# Patient Record
Sex: Male | Born: 1985 | Race: White | Hispanic: No | Marital: Single | Smoking: Current every day smoker
Health system: Southern US, Community
[De-identification: ages and names within clinical notes are randomized; demographics above are authoritative.]

## PROBLEM LIST (undated history)

## (undated) DIAGNOSIS — Z789 Other specified health status: Secondary | ICD-10-CM

## (undated) HISTORY — PX: FACIAL FRACTURE SURGERY: SHX1570

---

## 2006-05-29 ENCOUNTER — Emergency Department (HOSPITAL_COMMUNITY): Admission: EM | Admit: 2006-05-29 | Discharge: 2006-05-29 | Payer: Self-pay | Admitting: Emergency Medicine

## 2007-12-22 ENCOUNTER — Emergency Department (HOSPITAL_COMMUNITY): Admission: EM | Admit: 2007-12-22 | Discharge: 2007-12-22 | Payer: Self-pay | Admitting: Emergency Medicine

## 2008-09-17 IMAGING — CR DG HAND COMPLETE 3+V*L*
3 series · 3 of 3 positions shown · non-contrast
Comparison: None.

CLINICAL DATA: Abrasions.
 LEFT HAND ? 3 VIEW:

[view not recorded (1 of 3)]
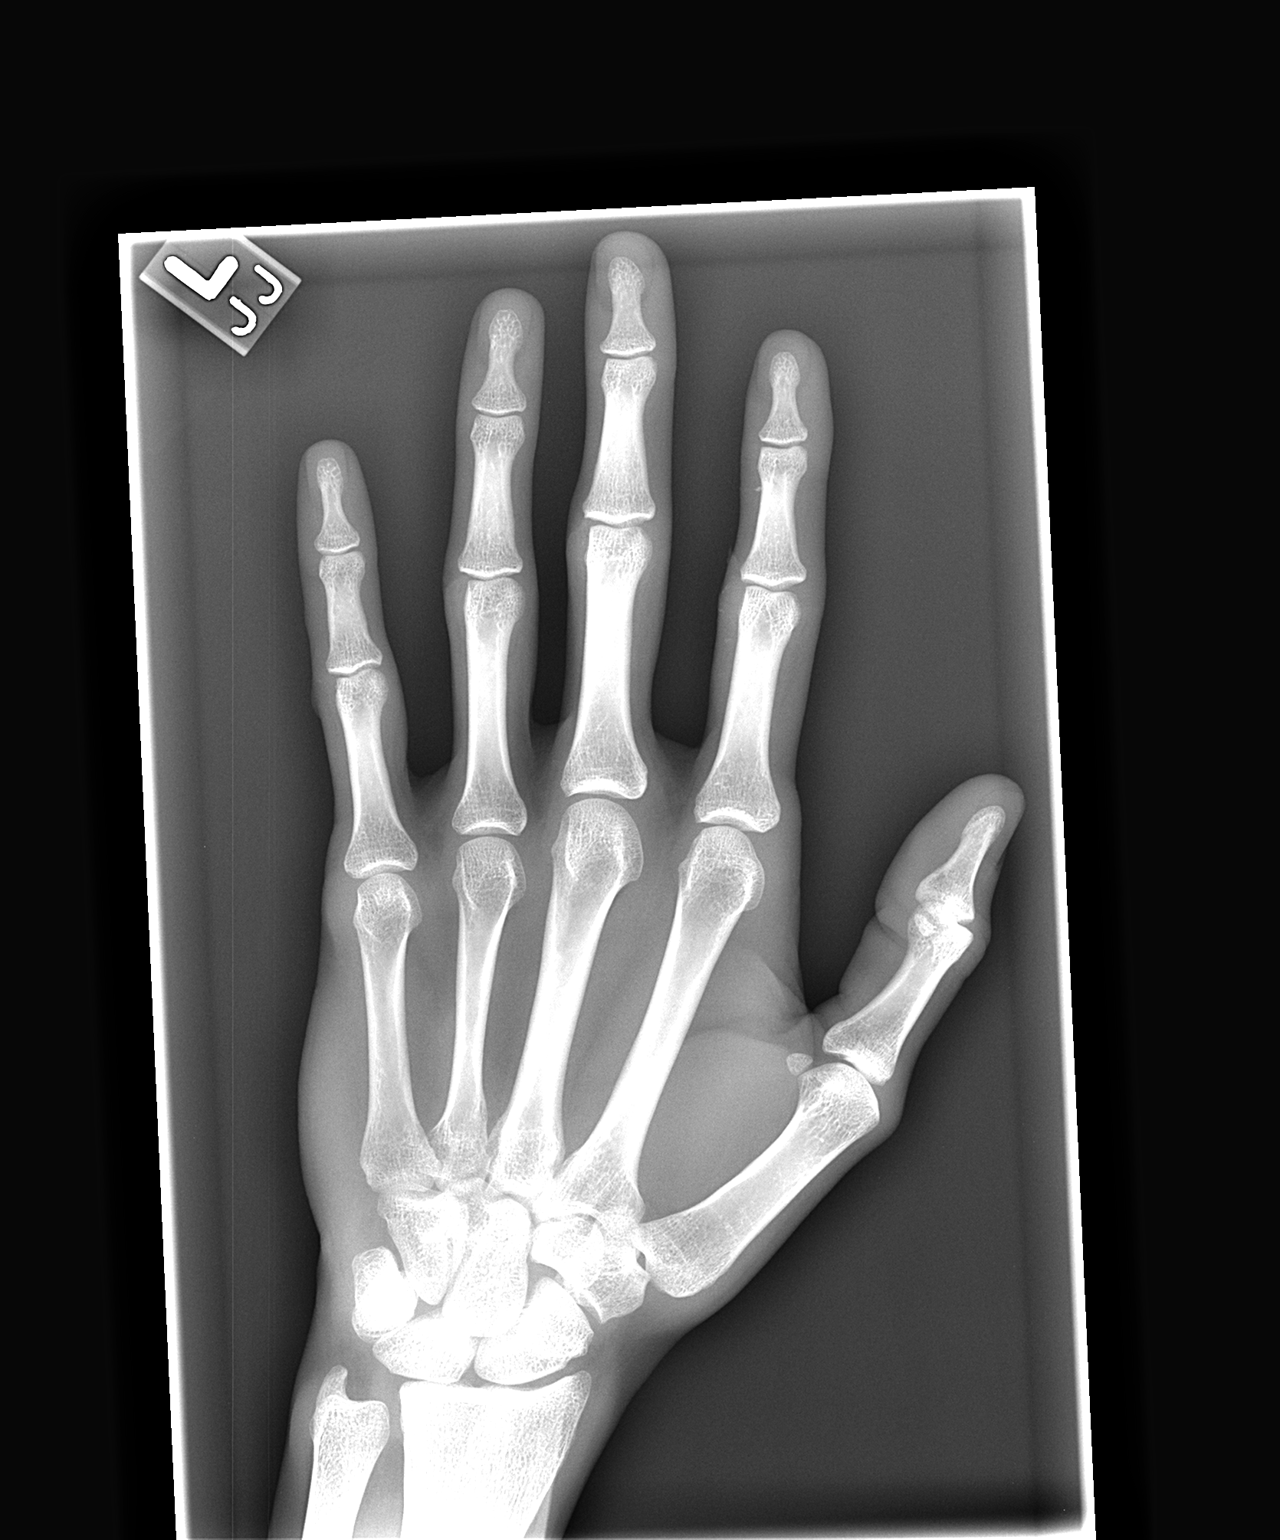

[view not recorded (2 of 3)]
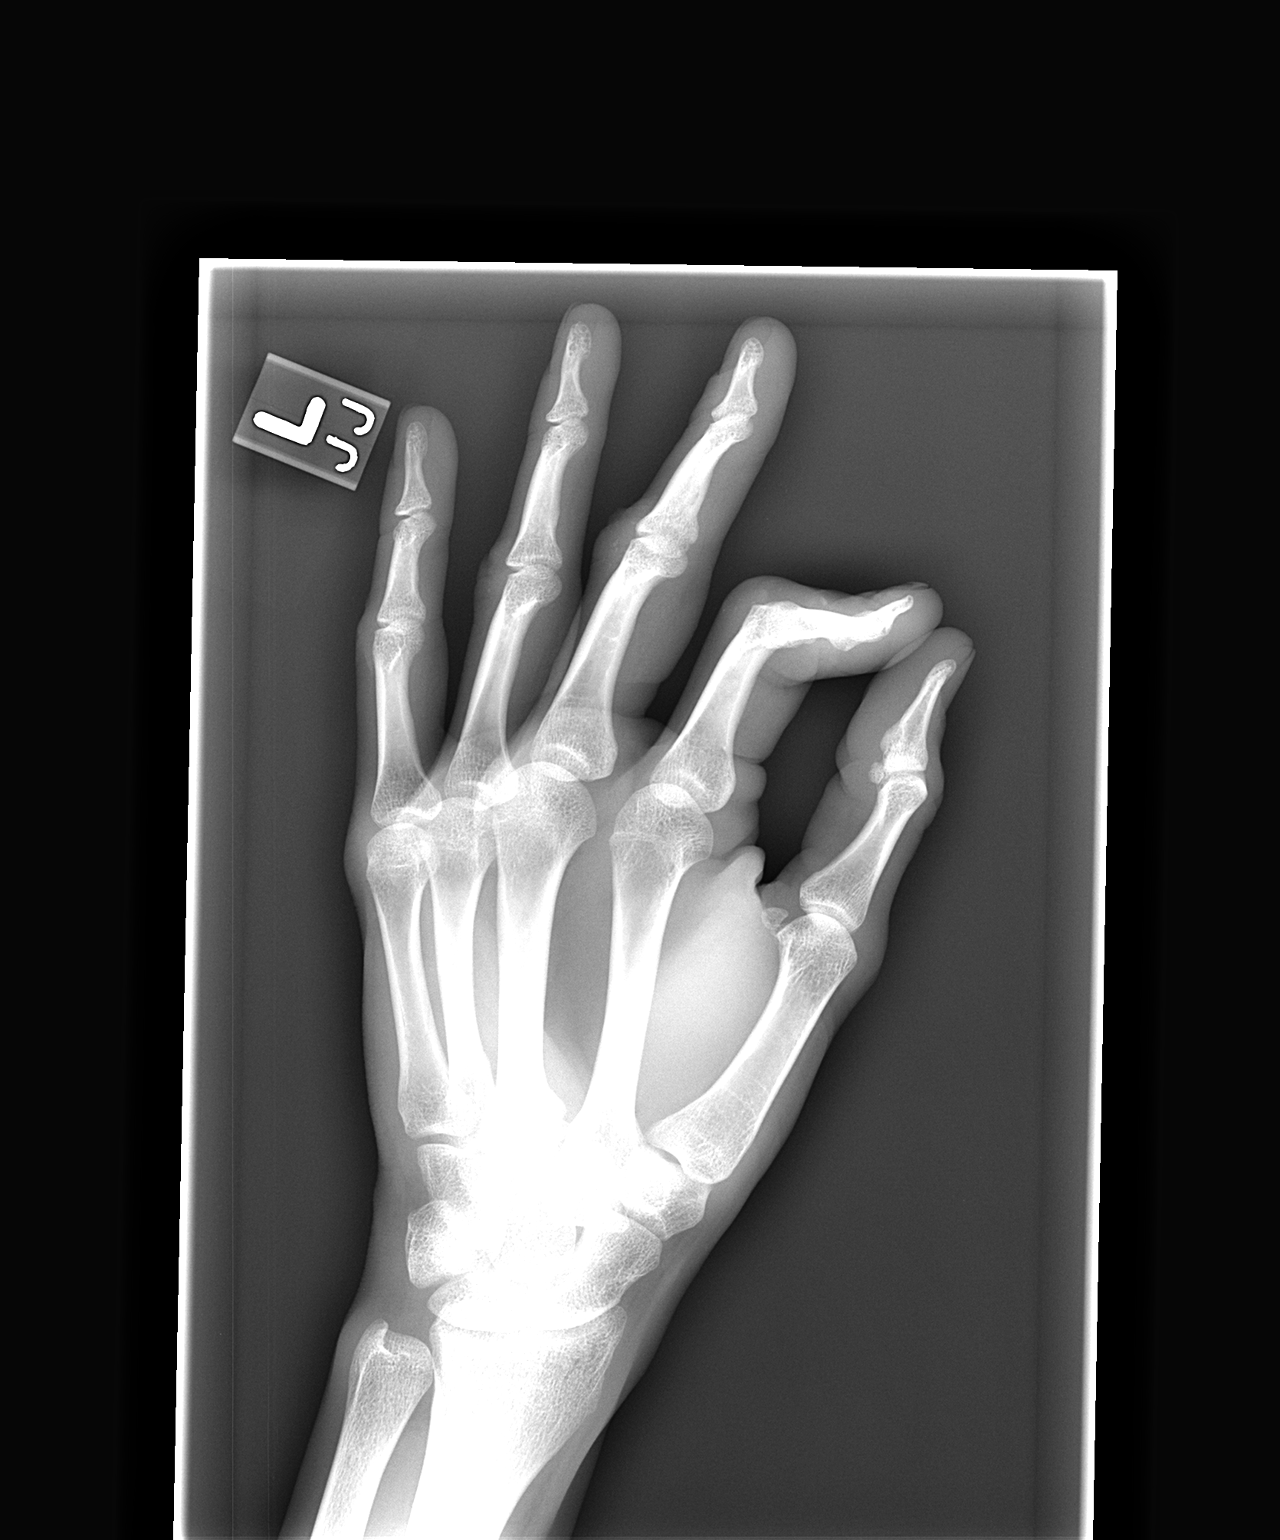

[view not recorded (3 of 3)]
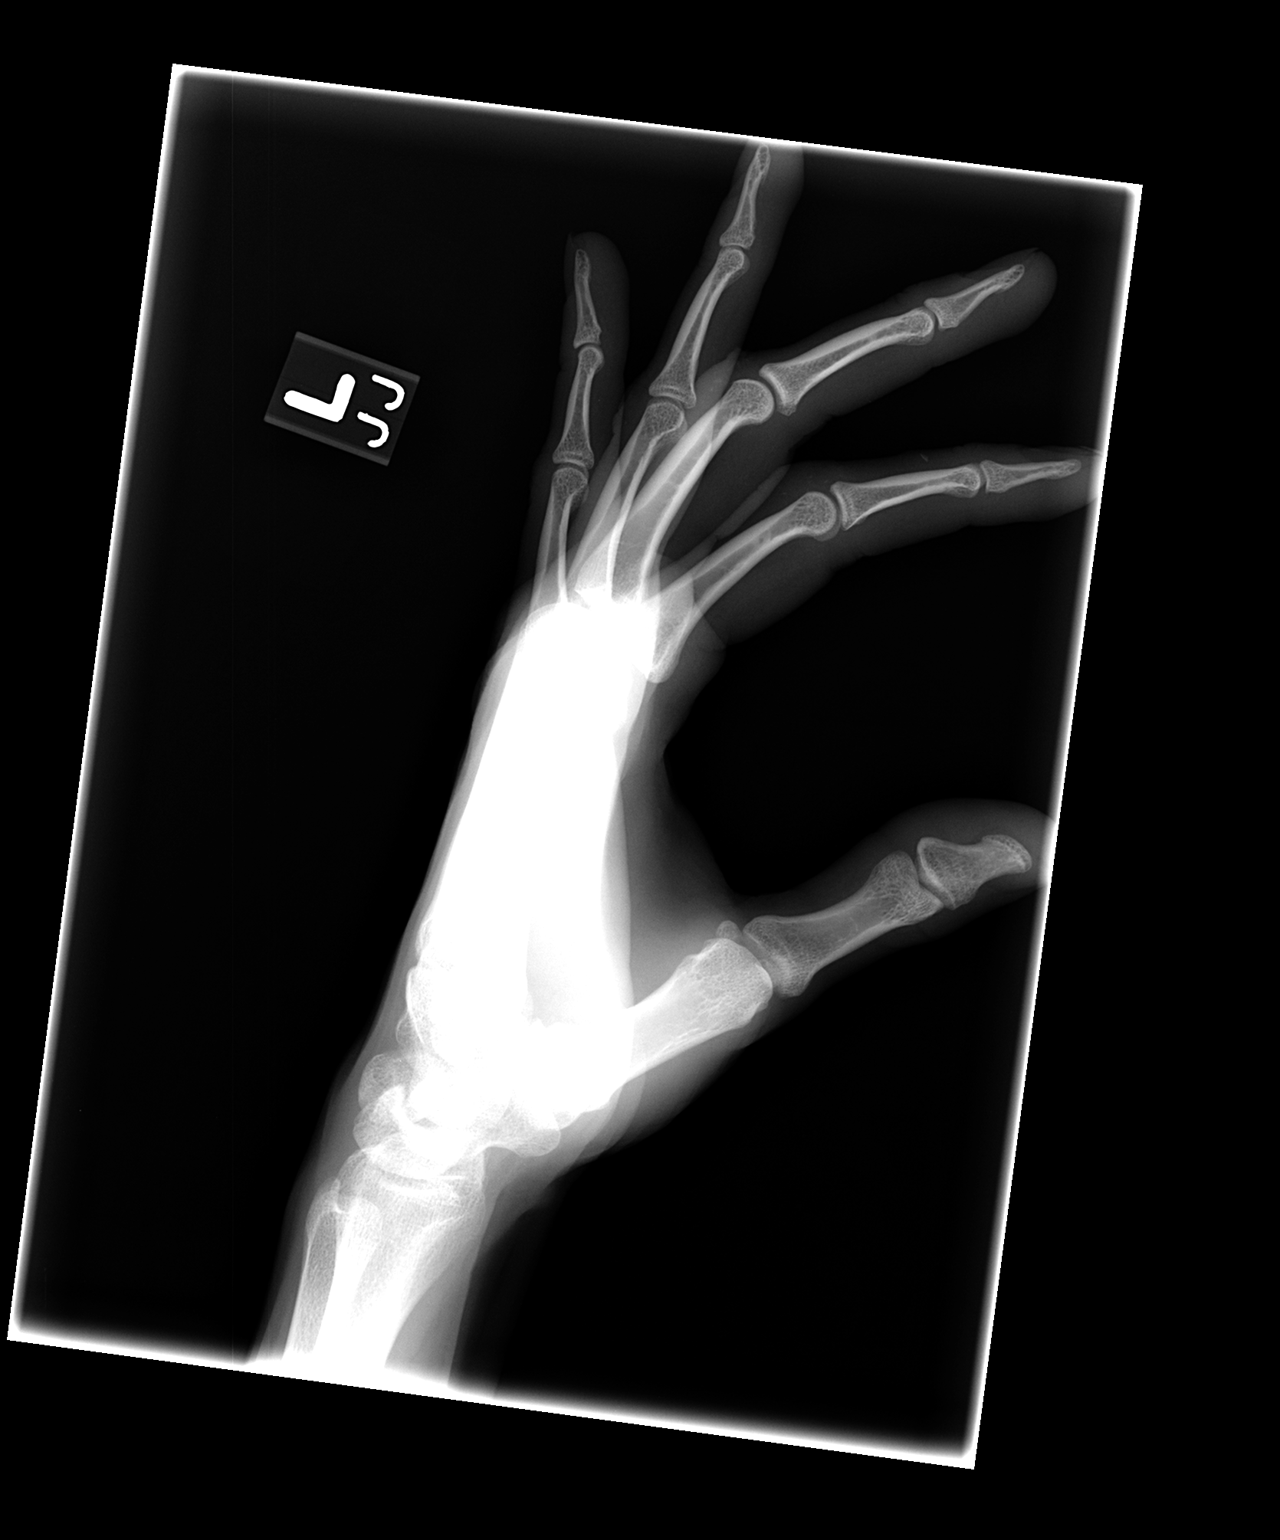

[3 of 3 positions shown; findings below may reference images not displayed]

FINDINGS: There is no fracture or dislocation.  Some radiopaque debris is identified along the PIP joint of the index finger and middle phalanx of the index finger. There also appears to be some debris at the PIP joint of the lung finger and possibly the ring finger.
IMPRESSION: Negative for fracture or dislocation with radiopaque debris most notable in the index finger

## 2015-02-12 ENCOUNTER — Other Ambulatory Visit (HOSPITAL_COMMUNITY): Payer: Self-pay | Admitting: Orthopaedic Surgery

## 2015-02-12 ENCOUNTER — Encounter (HOSPITAL_COMMUNITY): Payer: Self-pay | Admitting: *Deleted

## 2015-02-13 ENCOUNTER — Encounter (HOSPITAL_COMMUNITY): Payer: Self-pay | Admitting: *Deleted

## 2015-02-13 ENCOUNTER — Other Ambulatory Visit (HOSPITAL_COMMUNITY): Payer: Self-pay | Admitting: Orthopaedic Surgery

## 2015-02-13 NOTE — Progress Notes (Signed)
Denies cardiac history. Healthy 29 year old.

## 2015-02-14 ENCOUNTER — Ambulatory Visit (HOSPITAL_COMMUNITY): Payer: Medicaid Other

## 2015-02-14 ENCOUNTER — Observation Stay (HOSPITAL_COMMUNITY)
Admission: RE | Admit: 2015-02-14 | Discharge: 2015-02-15 | Disposition: A | Discharge status: Home / Self Care | Payer: Medicaid Other | Source: Ambulatory Visit | Attending: Orthopaedic Surgery | Admitting: Orthopaedic Surgery

## 2015-02-14 ENCOUNTER — Ambulatory Visit (HOSPITAL_COMMUNITY): Payer: Medicaid Other | Admitting: Certified Registered Nurse Anesthetist

## 2015-02-14 ENCOUNTER — Encounter (HOSPITAL_COMMUNITY)
Admission: RE | Disposition: A | Discharge status: Home / Self Care | Payer: Self-pay | Source: Ambulatory Visit | Attending: Orthopaedic Surgery

## 2015-02-14 ENCOUNTER — Encounter (HOSPITAL_COMMUNITY): Payer: Self-pay | Admitting: General Practice

## 2015-02-14 DIAGNOSIS — F17210 Nicotine dependence, cigarettes, uncomplicated: Secondary | ICD-10-CM | POA: Insufficient documentation

## 2015-02-14 DIAGNOSIS — S42009A Fracture of unspecified part of unspecified clavicle, initial encounter for closed fracture: Secondary | ICD-10-CM | POA: Diagnosis present

## 2015-02-14 DIAGNOSIS — Z791 Long term (current) use of non-steroidal anti-inflammatories (NSAID): Secondary | ICD-10-CM | POA: Diagnosis not present

## 2015-02-14 DIAGNOSIS — S42021A Displaced fracture of shaft of right clavicle, initial encounter for closed fracture: Principal | ICD-10-CM | POA: Insufficient documentation

## 2015-02-14 DIAGNOSIS — S42009K Fracture of unspecified part of unspecified clavicle, subsequent encounter for fracture with nonunion: Secondary | ICD-10-CM | POA: Diagnosis present

## 2015-02-14 DIAGNOSIS — W010XXA Fall on same level from slipping, tripping and stumbling without subsequent striking against object, initial encounter: Secondary | ICD-10-CM | POA: Insufficient documentation

## 2015-02-14 DIAGNOSIS — Z79891 Long term (current) use of opiate analgesic: Secondary | ICD-10-CM | POA: Diagnosis not present

## 2015-02-14 DIAGNOSIS — Z419 Encounter for procedure for purposes other than remedying health state, unspecified: Secondary | ICD-10-CM

## 2015-02-14 HISTORY — PX: ORIF CLAVICULAR FRACTURE: SHX5055

## 2015-02-14 HISTORY — DX: Other specified health status: Z78.9

## 2015-02-14 LAB — COMPREHENSIVE METABOLIC PANEL
ALBUMIN: 3.8 g/dL (ref 3.5–5.0)
ALK PHOS: 91 U/L (ref 38–126)
ALT: 47 U/L (ref 17–63)
ANION GAP: 8 (ref 5–15)
AST: 30 U/L (ref 15–41)
BILIRUBIN TOTAL: 0.9 mg/dL (ref 0.3–1.2)
BUN: 13 mg/dL (ref 6–20)
CALCIUM: 9.2 mg/dL (ref 8.9–10.3)
CO2: 25 mmol/L (ref 22–32)
Chloride: 103 mmol/L (ref 101–111)
Creatinine, Ser: 0.74 mg/dL (ref 0.61–1.24)
GFR calc Af Amer: >60 mL/min (ref >60)
GLUCOSE: 89 mg/dL (ref 65–99)
Potassium: 3.8 mmol/L (ref 3.5–5.1)
Sodium: 136 mmol/L (ref 135–145)
TOTAL PROTEIN: 6.5 g/dL (ref 6.5–8.1)

## 2015-02-14 LAB — URINALYSIS, ROUTINE W REFLEX MICROSCOPIC
BILIRUBIN URINE: NEGATIVE
GLUCOSE, UA: NEGATIVE mg/dL
Hgb urine dipstick: NEGATIVE
KETONES UR: NEGATIVE mg/dL
Leukocytes, UA: NEGATIVE
NITRITE: NEGATIVE
PH: 6.5 (ref 5.0–8.0)
PROTEIN: NEGATIVE mg/dL
Specific Gravity, Urine: 1.009 (ref 1.005–1.030)
Urobilinogen, UA: 0.2 mg/dL (ref 0.0–1.0)

## 2015-02-14 LAB — CBC
HEMATOCRIT: 43.3 % (ref 39.0–52.0)
HEMOGLOBIN: 14.7 g/dL (ref 13.0–17.0)
MCH: 30.6 pg (ref 26.0–34.0)
MCHC: 33.9 g/dL (ref 30.0–36.0)
MCV: 90 fL (ref 78.0–100.0)
Platelets: 247 10*3/uL (ref 150–400)
RBC: 4.81 MIL/uL (ref 4.22–5.81)
RDW: 12.5 % (ref 11.5–15.5)
WBC: 6.6 10*3/uL (ref 4.0–10.5)

## 2015-02-14 SURGERY — OPEN REDUCTION INTERNAL FIXATION (ORIF) CLAVICULAR FRACTURE
Anesthesia: Regional | Laterality: Right

## 2015-02-14 MED ORDER — 0.9 % SODIUM CHLORIDE (POUR BTL) OPTIME
TOPICAL | Status: DC | PRN
  Administered 2015-02-14: 1000 mL

## 2015-02-14 MED ORDER — ONDANSETRON HCL 4 MG/2ML IJ SOLN
INTRAMUSCULAR | Status: DC | PRN
  Administered 2015-02-14: 4 mg via INTRAVENOUS

## 2015-02-14 MED ORDER — PHENOL 1.4 % MT LIQD: 1.0000 | OROMUCOSAL | Status: DC | PRN

## 2015-02-14 MED ORDER — FENTANYL CITRATE (PF) 100 MCG/2ML IJ SOLN: 25.0000 ug | INTRAMUSCULAR | Status: DC | PRN

## 2015-02-14 MED ORDER — MIDAZOLAM HCL 5 MG/5ML IJ SOLN
INTRAMUSCULAR | Status: DC | PRN
  Administered 2015-02-14: 2 mg via INTRAVENOUS

## 2015-02-14 MED ORDER — PHENYLEPHRINE HCL 10 MG/ML IJ SOLN
10.0000 mg | INTRAVENOUS | Status: DC | PRN
  Administered 2015-02-14: 10 ug/min via INTRAVENOUS

## 2015-02-14 MED ORDER — CHLORHEXIDINE GLUCONATE 4 % EX LIQD: 60.0000 mL | Freq: Once | CUTANEOUS | Status: DC

## 2015-02-14 MED ORDER — ACETAMINOPHEN 325 MG PO TABS: 650.0000 mg | ORAL_TABLET | Freq: Four times a day (QID) | ORAL | Status: DC | PRN

## 2015-02-14 MED ORDER — METOCLOPRAMIDE HCL 5 MG/ML IJ SOLN
5.0000 mg | Freq: Three times a day (TID) | INTRAMUSCULAR | Status: DC | PRN
  Administered 2015-02-14: 10 mg via INTRAVENOUS
  Filled 2015-02-14: qty 2

## 2015-02-14 MED ORDER — NEOSTIGMINE METHYLSULFATE 10 MG/10ML IV SOLN
INTRAVENOUS | Status: AC
  Filled 2015-02-14: qty 1

## 2015-02-14 MED ORDER — LACTATED RINGERS IV SOLN
INTRAVENOUS | Status: DC | PRN
  Administered 2015-02-14 (×2): via INTRAVENOUS

## 2015-02-14 MED ORDER — METHOCARBAMOL 500 MG PO TABS
500.0000 mg | ORAL_TABLET | Freq: Four times a day (QID) | ORAL | Status: DC | PRN
  Administered 2015-02-14 – 2015-02-15 (×3): 500 mg via ORAL
  Filled 2015-02-14 (×3): qty 1

## 2015-02-14 MED ORDER — PROPOFOL 10 MG/ML IV BOLUS
INTRAVENOUS | Status: DC | PRN
  Administered 2015-02-14: 160 mg via INTRAVENOUS

## 2015-02-14 MED ORDER — NEOSTIGMINE METHYLSULFATE 10 MG/10ML IV SOLN
INTRAVENOUS | Status: DC | PRN
  Administered 2015-02-14: 3 mg via INTRAVENOUS

## 2015-02-14 MED ORDER — LIDOCAINE HCL (CARDIAC) 20 MG/ML IV SOLN
INTRAVENOUS | Status: AC
  Filled 2015-02-14: qty 5

## 2015-02-14 MED ORDER — MENTHOL 3 MG MT LOZG
1.0000 | LOZENGE | OROMUCOSAL | Status: DC | PRN
Start: 2015-02-14 — End: 2015-02-15
  Administered 2015-02-14: 3 mg via ORAL
  Filled 2015-02-14: qty 9

## 2015-02-14 MED ORDER — MIDAZOLAM HCL 2 MG/2ML IJ SOLN
INTRAMUSCULAR | Status: AC
  Administered 2015-02-14: 2 mg
  Filled 2015-02-14: qty 2

## 2015-02-14 MED ORDER — CEFAZOLIN SODIUM-DEXTROSE 2-3 GM-% IV SOLR: 2.0000 g | INTRAVENOUS | Status: DC

## 2015-02-14 MED ORDER — ROCURONIUM BROMIDE 50 MG/5ML IV SOLN
INTRAVENOUS | Status: AC
  Filled 2015-02-14: qty 1

## 2015-02-14 MED ORDER — BUPIVACAINE HCL (PF) 0.25 % IJ SOLN
INTRAMUSCULAR | Status: AC
  Filled 2015-02-14: qty 30

## 2015-02-14 MED ORDER — CEFAZOLIN SODIUM 1-5 GM-% IV SOLN
1.0000 g | Freq: Three times a day (TID) | INTRAVENOUS | Status: AC
  Administered 2015-02-14 – 2015-02-15 (×2): 1 g via INTRAVENOUS
  Filled 2015-02-14 (×3): qty 50

## 2015-02-14 MED ORDER — BUPIVACAINE HCL 0.25 % IJ SOLN
INTRAMUSCULAR | Status: DC | PRN
  Administered 2015-02-14: 20 mL

## 2015-02-14 MED ORDER — FENTANYL CITRATE (PF) 100 MCG/2ML IJ SOLN
INTRAMUSCULAR | Status: DC | PRN
  Administered 2015-02-14: 100 ug via INTRAVENOUS
  Administered 2015-02-14 (×3): 50 ug via INTRAVENOUS

## 2015-02-14 MED ORDER — METHOCARBAMOL 1000 MG/10ML IJ SOLN
500.0000 mg | Freq: Four times a day (QID) | INTRAVENOUS | Status: DC | PRN
  Filled 2015-02-14: qty 5

## 2015-02-14 MED ORDER — OXYCODONE HCL 5 MG PO TABS
5.0000 mg | ORAL_TABLET | ORAL | Status: DC | PRN
  Administered 2015-02-14 – 2015-02-15 (×5): 10 mg via ORAL
  Filled 2015-02-14 (×4): qty 2

## 2015-02-14 MED ORDER — ONDANSETRON HCL 4 MG/2ML IJ SOLN
INTRAMUSCULAR | Status: AC
  Filled 2015-02-14: qty 2

## 2015-02-14 MED ORDER — ROCURONIUM BROMIDE 100 MG/10ML IV SOLN
INTRAVENOUS | Status: DC | PRN
  Administered 2015-02-14: 50 mg via INTRAVENOUS

## 2015-02-14 MED ORDER — PROPOFOL 10 MG/ML IV BOLUS
INTRAVENOUS | Status: AC
  Filled 2015-02-14: qty 20

## 2015-02-14 MED ORDER — POTASSIUM CHLORIDE IN NACL 20-0.45 MEQ/L-% IV SOLN
INTRAVENOUS | Status: DC
  Administered 2015-02-14: 19:00:00 via INTRAVENOUS
  Filled 2015-02-14 (×3): qty 1000

## 2015-02-14 MED ORDER — FENTANYL CITRATE (PF) 250 MCG/5ML IJ SOLN
INTRAMUSCULAR | Status: AC
  Filled 2015-02-14: qty 5

## 2015-02-14 MED ORDER — METOCLOPRAMIDE HCL 5 MG PO TABS: 5.0000 mg | ORAL_TABLET | Freq: Three times a day (TID) | ORAL | Status: DC | PRN

## 2015-02-14 MED ORDER — GLYCOPYRROLATE 0.2 MG/ML IJ SOLN
INTRAMUSCULAR | Status: AC
  Filled 2015-02-14: qty 2

## 2015-02-14 MED ORDER — ONDANSETRON HCL 4 MG/2ML IJ SOLN
4.0000 mg | Freq: Four times a day (QID) | INTRAMUSCULAR | Status: DC | PRN
  Administered 2015-02-14 – 2015-02-15 (×3): 4 mg via INTRAVENOUS
  Filled 2015-02-14 (×3): qty 2

## 2015-02-14 MED ORDER — ONDANSETRON HCL 4 MG PO TABS: 4.0000 mg | ORAL_TABLET | Freq: Four times a day (QID) | ORAL | Status: DC | PRN

## 2015-02-14 MED ORDER — MIDAZOLAM HCL 2 MG/2ML IJ SOLN
INTRAMUSCULAR | Status: AC
  Filled 2015-02-14: qty 4

## 2015-02-14 MED ORDER — OXYCODONE HCL 5 MG/5ML PO SOLN: 5.0000 mg | Freq: Once | ORAL | Status: DC | PRN

## 2015-02-14 MED ORDER — GLYCOPYRROLATE 0.2 MG/ML IJ SOLN
INTRAMUSCULAR | Status: DC | PRN
  Administered 2015-02-14: 0.4 mg via INTRAVENOUS

## 2015-02-14 MED ORDER — CEFAZOLIN SODIUM-DEXTROSE 2-3 GM-% IV SOLR
INTRAVENOUS | Status: AC
  Administered 2015-02-14: 2 g via INTRAVENOUS
  Filled 2015-02-14: qty 50

## 2015-02-14 MED ORDER — LIDOCAINE HCL (CARDIAC) 20 MG/ML IV SOLN
INTRAVENOUS | Status: DC | PRN
  Administered 2015-02-14: 50 mg via INTRAVENOUS

## 2015-02-14 MED ORDER — OXYCODONE HCL 5 MG PO TABS: 5.0000 mg | ORAL_TABLET | Freq: Once | ORAL | Status: DC | PRN

## 2015-02-14 MED ORDER — ACETAMINOPHEN 650 MG RE SUPP: 650.0000 mg | Freq: Four times a day (QID) | RECTAL | Status: DC | PRN

## 2015-02-14 MED ORDER — HYDROMORPHONE HCL 1 MG/ML IJ SOLN
1.0000 mg | INTRAMUSCULAR | Status: DC | PRN
  Administered 2015-02-14 – 2015-02-15 (×3): 1 mg via INTRAVENOUS
  Filled 2015-02-14 (×4): qty 1

## 2015-02-14 MED ORDER — FENTANYL CITRATE (PF) 100 MCG/2ML IJ SOLN
INTRAMUSCULAR | Status: AC
  Administered 2015-02-14: 100 ug
  Filled 2015-02-14: qty 2

## 2015-02-14 MED ORDER — ONDANSETRON HCL 4 MG/2ML IJ SOLN: 4.0000 mg | Freq: Once | INTRAMUSCULAR | Status: DC | PRN

## 2015-02-14 SURGICAL SUPPLY — 61 items
BIT DRILL 2.5X2.75 QC CALB (BIT) ×3 IMPLANT
CLOSURE STERI-STRIP 1/2X4 (GAUZE/BANDAGES/DRESSINGS) ×1
CLOSURE WOUND 1/2 X4 (GAUZE/BANDAGES/DRESSINGS) ×1
CLSR STERI-STRIP ANTIMIC 1/2X4 (GAUZE/BANDAGES/DRESSINGS) ×2 IMPLANT
COVER SURGICAL LIGHT HANDLE (MISCELLANEOUS) ×3 IMPLANT
DRAPE C-ARM 42X72 X-RAY (DRAPES) ×3 IMPLANT
DRAPE IMP U-DRAPE 54X76 (DRAPES) ×3 IMPLANT
DRAPE INCISE IOBAN 66X45 STRL (DRAPES) IMPLANT
DRAPE U-SHAPE 47X51 STRL (DRAPES) ×3 IMPLANT
DRSG EMULSION OIL 3X3 NADH (GAUZE/BANDAGES/DRESSINGS) ×3 IMPLANT
DRSG MEPILEX BORDER 4X8 (GAUZE/BANDAGES/DRESSINGS) ×2 IMPLANT
ELECT REM PT RETURN 9FT ADLT (ELECTROSURGICAL) ×3
ELECTRODE REM PT RTRN 9FT ADLT (ELECTROSURGICAL) ×1 IMPLANT
GAUZE SPONGE 4X4 12PLY STRL (GAUZE/BANDAGES/DRESSINGS) ×3 IMPLANT
GLOVE BIO SURGEON STRL SZ 6.5 (GLOVE) ×1 IMPLANT
GLOVE BIO SURGEONS STRL SZ 6.5 (GLOVE) ×1
GLOVE BIOGEL PI IND STRL 6 (GLOVE) IMPLANT
GLOVE BIOGEL PI IND STRL 8 (GLOVE) ×2 IMPLANT
GLOVE BIOGEL PI INDICATOR 6 (GLOVE) ×2
GLOVE BIOGEL PI INDICATOR 8 (GLOVE) ×4
GLOVE ORTHO TXT STRL SZ7.5 (GLOVE) ×6 IMPLANT
GOWN STRL REUS W/ TWL LRG LVL3 (GOWN DISPOSABLE) ×1 IMPLANT
GOWN STRL REUS W/ TWL XL LVL3 (GOWN DISPOSABLE) ×1 IMPLANT
GOWN STRL REUS W/TWL 2XL LVL3 (GOWN DISPOSABLE) ×3 IMPLANT
GOWN STRL REUS W/TWL LRG LVL3 (GOWN DISPOSABLE) ×3
GOWN STRL REUS W/TWL XL LVL3 (GOWN DISPOSABLE) ×3
KIT BASIN OR (CUSTOM PROCEDURE TRAY) ×3 IMPLANT
KIT ROOM TURNOVER OR (KITS) ×3 IMPLANT
MANIFOLD NEPTUNE II (INSTRUMENTS) ×3 IMPLANT
NDL HYPO 25GX1X1/2 BEV (NEEDLE) IMPLANT
NEEDLE HYPO 25GX1X1/2 BEV (NEEDLE) IMPLANT
NS IRRIG 1000ML POUR BTL (IV SOLUTION) ×3 IMPLANT
PACK SHOULDER (CUSTOM PROCEDURE TRAY) ×3 IMPLANT
PACK UNIVERSAL I (CUSTOM PROCEDURE TRAY) ×3 IMPLANT
PAD ARMBOARD 7.5X6 YLW CONV (MISCELLANEOUS) ×8 IMPLANT
PLATE FIBULAR COMP LOCK 10H (Plate) ×2 IMPLANT
SCREW CORT 3.5X26 (Screw) ×3 IMPLANT
SCREW CORT T15 24X3.5XST LCK (Screw) IMPLANT
SCREW CORT T15 26X3.5XST LCK (Screw) IMPLANT
SCREW CORTICAL 3.5X24MM (Screw) ×3 IMPLANT
SCREW CORTICAL LOW PROF 3.5X20 (Screw) ×4 IMPLANT
SCREW LOCK CORT STAR 3.5X12 (Screw) ×3 IMPLANT
SCREW LOCK CORT STAR 3.5X16 (Screw) ×3 IMPLANT
SCREW LOCK CORT STAR 3.5X18 (Screw) ×2 IMPLANT
SCREW LOW PROFILE 18MMX3.5MM (Screw) ×8 IMPLANT
SHIELD EYE LENSE ONLY DISP (MISCELLANEOUS) ×2 IMPLANT
SPONGE GAUZE 4X4 12PLY STER LF (GAUZE/BANDAGES/DRESSINGS) ×2 IMPLANT
SPONGE LAP 18X18 X RAY DECT (DISPOSABLE) ×6 IMPLANT
STAPLER PROXIMATE FIRING4 30MM (STAPLE) ×2 IMPLANT
STRIP CLOSURE SKIN 1/2X4 (GAUZE/BANDAGES/DRESSINGS) ×2 IMPLANT
SUCTION FRAZIER TIP 10 FR DISP (SUCTIONS) ×3 IMPLANT
SUT PROLENE 3 0 PS 1 (SUTURE) ×3 IMPLANT
SUT VIC AB 0 CT1 27 (SUTURE) ×6
SUT VIC AB 0 CT1 27XBRD ANBCTR (SUTURE) IMPLANT
SUT VIC AB 2-0 CT1 27 (SUTURE) ×9
SUT VIC AB 2-0 CT1 TAPERPNT 27 (SUTURE) ×1 IMPLANT
SUT VICRYL 0 CT 1 36IN (SUTURE) ×3 IMPLANT
SYR CONTROL 10ML LL (SYRINGE) IMPLANT
WATER STERILE IRR 1000ML POUR (IV SOLUTION) ×3 IMPLANT
WIRE K 1.6MM 144256 (MISCELLANEOUS) ×4 IMPLANT
YANKAUER SUCT BULB TIP NO VENT (SUCTIONS) ×3 IMPLANT

## 2015-02-14 NOTE — Progress Notes (Signed)
Orthopedic Tech Progress Note Patient Details:  Kathyrn DrownJeffrey Polson 09/17/1985 [id number] Patient has arm sling on. Patient ID: Kathyrn DrownJeffrey Hickson, male   DOB: 06/27/1985, 29 y.o.   MRN: [id number]   Jennye MoccasinHughes, Courtenay Hirth Craig 02/14/2015, 5:46 PM

## 2015-02-14 NOTE — H&P (Signed)
Robert [name] is an 29 y.o. male.   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  Here for injury when he slipped and fell, landing onhisrightshoulder with a mid-shaft clavicle fracture with slight comminution.  There is some prominence.  The skin is not terribly tinted.  Sensation of the hand is intact.  He is right hand dominant.  Patient has no past history of injury to the clavicle.  He has been using ibuprofen and this is his first encounter since the fracture, which was about 3 years ago.  He normally sees Dr. Zenaida Deed.  Patient lives in [place].     MEDICATIONS:  None other than ibuprofen, which he just started taking soon after the injuries.    ALLERGIES:  None.    PAST SURGICAL HISTORY:  None.    SOCIAL HISTORY:  Patient is single.  Living with his girlfriend, who recently had a set of twins.  He smokes 1 pack per day.  Drinks about once per year.     REVIEW OF SYSTEMS:  A 14-point review of systems is negative for rheumatologic conditions.       Past Medical History  Diagnosis Date  . Medical history non-contributory     Past Surgical History  Procedure Laterality Date  . Facial fracture surgery      at 7 months old     Family History  Problem Relation Age of Onset  . Heart disease Mother   . Hypertension Father    Social History:  reports that he has been smoking Cigarettes.  He has a 12 pack-year smoking history. He has never used smokeless tobacco. He reports that he does not drink alcohol or use illicit drugs.  Allergies: No Known Allergies  Medications Prior to Admission  Medication Sig Dispense Refill  . ibuprofen (ADVIL,MOTRIN) 200 MG tablet Take 400-600 mg by mouth every 6 (six) hours as needed for mild pain.     Marland Kitchen oxyCODONE-acetaminophen (PERCOCET/ROXICET) 5-325 MG tablet Take 1 tablet by mouth every 4 (four) hours as needed for severe pain. Patient takes 1-2 tablets every 4 hours as needed      No results found for this or any previous visit  (from the past 48 hour(s)). No results found.  Review of Systems  Constitutional: Negative.   HENT: Negative.   Respiratory: Negative.   Cardiovascular: Negative.   Gastrointestinal: Negative.   Genitourinary: Negative.   Musculoskeletal: Positive for joint pain.  Skin: Negative.   Neurological: Negative.   Psychiatric/Behavioral: Negative.     There were no vitals taken for this visit. Physical Exam  Constitutional: He is oriented to person, place, and time. No distress.  HENT:  Head: Atraumatic.  Eyes: EOM are normal.  Neck: Normal range of motion.  Respiratory: No respiratory distress.  GI: He exhibits no distension.  Musculoskeletal: He exhibits tenderness.  Neurological: He is alert and oriented to person, place, and time.  Skin: Skin is warm and dry.  Psychiatric: He has a normal mood and affect.    PHYSICAL EXAMINATION:  Patient is 6 feet, 4 inches, 195 pounds.  Alert and oriented.  Arms across his chest.  He is in moderate discomfort.  No accessory muscle inspiratory effort.  This is a closed injury.  Axillary, medial and radial ulnar sensation is intact.     RADIOGRAPHS:  X-rays are reviewed with the patient.        ASSESSMENT:  Mid-shaft right clavicle fracture with some comminution.   PLAN:  Currently patient  does not have any insurance.  We discussed options, operative versus nonoperative treatment.  He would like to proceed with nonoperative treatment.  A prescription for Percocet is given.  Will recheck him in 4 weeks.  We discussed sleeping in a recliner, use of ice intermittently, ibuprofen, pain medication, problems with pain medicine, taking with food, etc.  Single x-ray, right clavicle, on return.   ADDENDUM:  Possibility of nonunion, delayed union, optional late operative fixation if he is not showing some healing.  Appropriate activities were discussed, so he is more likely to heal, which includes keeping it in a sling.   Latonya Knight M 02/14/2015, 10:09  AM

## 2015-02-14 NOTE — Anesthesia Preprocedure Evaluation (Signed)
Anesthesia Evaluation  Patient identified by MRN, date of birth, ID band Patient awake    Reviewed: Allergy & Precautions, NPO status , Patient's Chart, lab work & pertinent test results  Airway Mallampati: II  TM Distance: >3 FB Neck ROM: Full    Dental  (+) Teeth Intact, Dental Advisory Given   Pulmonary Current Smoker,    breath sounds clear to auscultation       Cardiovascular  Rhythm:Regular Rate:Normal     Neuro/Psych    GI/Hepatic   Endo/Other    Renal/GU      Musculoskeletal   Abdominal   Peds  Hematology   Anesthesia Other Findings   Reproductive/Obstetrics                             Anesthesia Physical Anesthesia Plan  ASA: II  Anesthesia Plan: General   Post-op Pain Management: GA combined w/ Regional for post-op pain   Induction: Intravenous  Airway Management Planned: Oral ETT  Additional Equipment:   Intra-op Plan:   Post-operative Plan:   Informed Consent: I have reviewed the patients History and Physical, chart, labs and discussed the procedure including the risks, benefits and alternatives for the proposed anesthesia with the patient or authorized representative who has indicated his/her understanding and acceptance.   Dental advisory given  Plan Discussed with: CRNA and Anesthesiologist  Anesthesia Plan Comments: (R. Clavicular fracture Smoker  Plan GA with interscalene  Kipp Broodavid Zully Frane)        Anesthesia Quick Evaluation

## 2015-02-14 NOTE — Brief Op Note (Signed)
02/14/2015  2:48 PM  PATIENT:  Kathyrn DrownJeffrey Bellizzi  29 y.o. male  PRE-OPERATIVE DIAGNOSIS:  Right Clavicle Fracture  POST-OPERATIVE DIAGNOSIS:  Right Clavicle Fracture  PROCEDURE:  Procedure(s): OPEN REDUCTION INTERNAL FIXATION (ORIF) CLAVICULAR FRACTURE (Right)  SURGEON:  Surgeon(s) and Role:    * Eldred MangesMark C Yates, MD - Primary  PHYSICIAN ASSISTANT: Alonte Wulff m. Barry Dienesowens    ANESTHESIA:   general  EBL:  Total I/O In: 1000 [I.V.:1000] Out: -   BLOOD ADMINISTERED:none  DRAINS: none   LOCAL MEDICATIONS USED:  MARCAINE     SPECIMEN:  No Specimen  DISPOSITION OF SPECIMEN:  N/A  COUNTS:  YES  TOURNIQUET:  * No tourniquets in log *  DICTATION: .Dragon Dictation  PLAN OF CARE: Admit for overnight observation  PATIENT DISPOSITION:  PACU - hemodynamically stable.

## 2015-02-14 NOTE — Interval H&P Note (Signed)
History and Physical Interval Note:  02/14/2015 12:14 PM  Robert [name]  has presented today for surgery, with the diagnosis of Right Clavicle Fracture  The various methods of treatment have been discussed with the patient and family. After consideration of risks, benefits and other options for treatment, the patient has consented to  Procedure(s): OPEN REDUCTION INTERNAL FIXATION (ORIF) CLAVICULAR FRACTURE (Right) as a surgical intervention .  The patient's history has been reviewed, patient examined, no change in status, stable for surgery.  I have reviewed the patient's chart and labs.  Questions were answered to the patient's satisfaction.     YATES,MARK C

## 2015-02-14 NOTE — Op Note (Signed)
NAMKathyrn [name]:  [name], Robert            ACCOUNT NO.:  [id number]  MEDICAL RECORD NO.:  [id number]  LOCATION:  5N06C                        FACILITY:  [facility]  PHYSICIAN:  Mark C. Ophelia CharterYates, M.D.    DATE OF BIRTH:  1985-07-26  DATE OF PROCEDURE:  02/14/2015 DATE OF DISCHARGE:                              OPERATIVE REPORT   PREOPERATIVE DIAGNOSIS:  Right comminuted clavicle fracture.  POSTOPERATIVE DIAGNOSIS:  Right comminuted clavicle fracture.  PROCEDURE:  Open reduction and internal fixation of right clavicle.  SURGEON:  Mark C. Ophelia CharterYates, M.D.  ASSISTANT:  Genene ChurnJames M. Denton Meekwens, P.A., medically necessary and present for the entire procedure.  ANESTHESIA:  General.  ESTIMATED BLOOD LOSS:  Minimal.  INDICATIONS:  This 29 year old had fairly comminuted clavicle fracture with 2 large butterfly fragments and tenting of the skin from the medial fragment with a sharp spike and pain.  We discussed conservative treatment.  The patient called, stating he wanted to proceed with operative intervention.  DESCRIPTION OF PROCEDURE:  Standard prepping and draping, preoperative Ancef.  After intubation with the patient on the shoulder frame, time- out procedure completed.  10/15 drapes were used before Betadine prep, impervious stockinette, Coban, extremity sheets and drapes were applied. Incision was made after sterile skin marker, Betadine, Steri-Drape, time- out procedure over the clavicle.  Medial clavicle was identified, subperiosteal dissection.  First butterfly fragment was identified.  The lateral clavicle portion initially was very difficult to identify. Incision had to be extended out laterally finding the chromium and then falling back across the [facility].  Clavicle was inferior and posterior and was followed.  Second butterfly fragment was identified and carefully the butterfly fragments were attached, 1 to the medial, 1 the lateral, held with K-wires, placed from inferior to anterior  to posterior to superior, so that will not be in the way of a plate. Biomet composite titanium plate was used with the purple locking or a green locking guides laterally, so that they could be angled and rotated to conform that the clavicle medially was bent.  It was held with a self- retaining clamp and multiple screws were placed, filling all holes, and then additional screw was placed from distal to lateral, catching the second butterfly lateral fragment to the most distal piece in the clavicle for additional fixation.  Irrigation with saline solution. Area of the deltoid was split, trying to search for the clavicle that went down close to the coracoid, was repaired with 0 interrupted Vicryl undyed sutures, 0 was used for reapproximating muscle overlying the clavicle, 2-0 Vicryl in subcutaneous tissue, skin staple closure, postop dressing, and transferred to the recovery room.  Instrument count and needle count were correct.     Mark C. Ophelia CharterYates, M.D.     MCY/MEDQ  D:  02/14/2015  T:  02/14/2015  Job:  [id number]

## 2015-02-14 NOTE — Transfer of Care (Signed)
Immediate Anesthesia Transfer of Care Note  Patient: Robert DrownJeffrey [name]  Procedure(s) Performed: Procedure(s): OPEN REDUCTION INTERNAL FIXATION (ORIF) CLAVICULAR FRACTURE (Right)  Patient Location: PACU  Anesthesia Type:General and GA combined with regional for post-op pain  Level of Consciousness: awake, alert , oriented and patient cooperative  Airway & Oxygen Therapy: Patient Spontanous Breathing and Patient connected to nasal cannula oxygen  Post-op Assessment: Report given to RN, Post -op Vital signs reviewed and stable and Patient moving all extremities  Post vital signs: Reviewed and stable  Last Vitals:  Filed Vitals:   02/14/15 1220  BP: 166/79  Pulse: 89  Temp:   Resp: 15    Complications: No apparent anesthesia complications

## 2015-02-14 NOTE — Anesthesia Postprocedure Evaluation (Signed)
  Anesthesia Post-op Note  Patient: Robert DrownJeffrey [name]  Procedure(s) Performed: Procedure(s): OPEN REDUCTION INTERNAL FIXATION (ORIF) CLAVICULAR FRACTURE (Right)  Patient Location: PACU  Anesthesia Type:General and GA combined with regional for post-op pain  Level of Consciousness: awake, alert  and oriented  Airway and Oxygen Therapy: Patient Spontanous Breathing  Post-op Pain: none  Post-op Assessment: Post-op Vital signs reviewed, Patient's Cardiovascular Status Stable, Respiratory Function Stable, Patent Airway and Pain level controlled              Post-op Vital Signs: stable  Last Vitals:  Filed Vitals:   02/14/15 1605  BP: 143/88  Pulse: 92  Temp: 36.5 C  Resp: 16    Complications: No apparent anesthesia complications

## 2015-02-14 NOTE — Anesthesia Procedure Notes (Addendum)
Procedure Name: MAC Date/Time: 02/14/2015 12:45 PM Performed by: Ferol LuzMCMILLEN, MICHAEL L Pre-anesthesia Checklist: Patient identified, Emergency Drugs available, Suction available, Patient being monitored and Timeout performed Patient Re-evaluated:Patient Re-evaluated prior to inductionOxygen Delivery Method: Circle system utilized Preoxygenation: Pre-oxygenation with 100% oxygen Intubation Type: IV induction Ventilation: Mask ventilation without difficulty and Oral airway inserted - appropriate to patient size Laryngoscope Size: Mac and 4 Grade View: Grade II Tube type: Oral Tube size: 7.5 mm Number of attempts: 1 Airway Equipment and Method: Stylet Placement Confirmation: ETT inserted through vocal cords under direct vision,  positive ETCO2 and breath sounds checked- equal and bilateral Secured at: 22 cm Tube secured with: Tape Dental Injury: Teeth and Oropharynx as per pre-operative assessment    Anesthesia Regional Block:  Interscalene brachial plexus block  Pre-Anesthetic Checklist: ,, timeout performed, Correct Patient, Correct Site, Correct Laterality, Correct Procedure, Correct Position, site marked, Risks and benefits discussed,  Surgical consent,  Pre-op evaluation,  At surgeon's request and post-op pain management  Laterality: Right  Prep: chloraprep       Needles:   Needle Type: Echogenic Stimulator Needle     Needle Length: 5cm 5 cm Needle Gauge: 21 and 21 G    Additional Needles:  Procedures: ultrasound guided (picture in chart) Interscalene brachial plexus block Narrative:  Start time: 02/14/2015 1:25 PM End time: 02/14/2015 1:30 PM Injection made incrementally with aspirations every 5 mL.  Performed by: Personally   Additional Notes: 30 cc 0.5% marcaine with 1:200 Epi injected easily

## 2015-02-15 ENCOUNTER — Encounter (HOSPITAL_COMMUNITY): Payer: Self-pay | Admitting: Orthopaedic Surgery

## 2015-02-15 DIAGNOSIS — S42021A Displaced fracture of shaft of right clavicle, initial encounter for closed fracture: Secondary | ICD-10-CM | POA: Diagnosis not present

## 2015-02-15 MED ORDER — OXYCODONE-ACETAMINOPHEN 5-325 MG PO TABS: 1.0000 | ORAL_TABLET | Freq: Four times a day (QID) | ORAL | Status: AC | PRN

## 2015-02-15 MED ORDER — METHOCARBAMOL 500 MG PO TABS: 500.0000 mg | ORAL_TABLET | Freq: Four times a day (QID) | ORAL | Status: AC | PRN

## 2015-03-02 NOTE — Discharge Summary (Signed)
Patient ID: Robert [name] MRN: [id number] DOB/AGE: 11-09-1985 29 y.o.  Admit date: 02/14/2015 Discharge date: 03/02/2015  Admission Diagnoses:  Active Problems:   Clavicle fracture   Discharge Diagnoses:  Active Problems:   Clavicle fracture  status post Procedure(s): OPEN REDUCTION INTERNAL FIXATION (ORIF) CLAVICULAR FRACTURE  Past Medical History  Diagnosis Date  . Medical history non-contributory     Surgeries: Procedure(s): OPEN REDUCTION INTERNAL FIXATION (ORIF) CLAVICULAR FRACTURE on 02/14/2015   Consultants:    Discharged Condition: Improved  Hospital Course: Robert [name] is an 29 y.o. male who was admitted 02/14/2015 for operative treatment of clavicle fracture Patient failed conservative treatments (please see the history and physical for the specifics) and had severe unremitting pain that affects sleep, daily activities and work/hobbies. After pre-op clearance, the patient was taken to the operating room on 02/14/2015 and underwent  Procedure(s): OPEN REDUCTION INTERNAL FIXATION (ORIF) CLAVICULAR FRACTURE.    Patient was given perioperative antibiotics:  Anti-infectives    Start     Dose/Rate Route Frequency Ordered Stop   02/14/15 2030  ceFAZolin (ANCEF) IVPB 1 g/50 mL premix     1 g 100 mL/hr over 30 Minutes Intravenous 3 times per day 02/14/15 1632 02/15/15 0643   02/14/15 1107  ceFAZolin (ANCEF) 2-3 GM-% IVPB SOLR    Comments:  Forte, Lindsi   : cabinet override      02/14/15 1107 02/14/15 1255   02/14/15 1055  ceFAZolin (ANCEF) IVPB 2 g/50 mL premix  Status:  Discontinued     2 g 100 mL/hr over 30 Minutes Intravenous On call to O.R. 02/14/15 1055 02/14/15 1609       Patient was given sequential compression devices and early ambulation to prevent DVT.   Patient benefited maximally from hospital stay and there were no complications. At the time of discharge, the patient was urinating/moving their bowels without difficulty, tolerating a regular  diet, pain is controlled with oral pain medications and they have been cleared by PT/OT.   Recent vital signs: No data found.    Recent laboratory studies: No results for input(s): WBC, HGB, HCT, PLT, NA, K, CL, CO2, BUN, CREATININE, GLUCOSE, INR, CALCIUM in the last 72 hours.  Invalid input(s): PT, 2   Discharge Medications:     Medication List    STOP taking these medications        ibuprofen 200 MG tablet  Commonly known as:  ADVIL,MOTRIN      TAKE these medications        methocarbamol 500 MG tablet  Commonly known as:  ROBAXIN  Take 1 tablet (500 mg total) by mouth every 6 (six) hours as needed for muscle spasms.     oxyCODONE-acetaminophen 5-325 MG tablet  Commonly known as:  PERCOCET/ROXICET  Take 1-2 tablets by mouth every 6 (six) hours as needed for severe pain. Patient takes 1-2 tablets every 4 hours as needed        Diagnostic Studies: Dg Clavicle Right  02/14/2015  CLINICAL DATA:  ORIF right clavicle fracture- EXAM: RIGHT CLAVICLE - 2+ VIEWS; DG C-ARM 61-120 MIN COMPARISON:  None. FINDINGS: Plate and screw fixation of the right clavicle noted. Anatomic alignment. No hardware complicating feature be IMPRESSION: Internal fixation of the right clavicle with anatomic alignment. Electronically Signed   By: Charlett Nose M.D.   On: 02/14/2015 14:48   Dg C-arm 1-60 Min  02/14/2015  CLINICAL DATA:  ORIF right clavicle fracture- EXAM: RIGHT CLAVICLE - 2+ VIEWS; DG C-ARM 61-120 MIN  COMPARISON:  None. FINDINGS: Plate and screw fixation of the right clavicle noted. Anatomic alignment. No hardware complicating feature be IMPRESSION: Internal fixation of the right clavicle with anatomic alignment. Electronically Signed   By: Charlett NoseKevin  Dover M.D.   On: 02/14/2015 14:48        Discharge Instructions    Call MD / Call 911    Complete by:  As directed   If you experience chest pain or shortness of breath, CALL 911 and be transported to the hospital emergency room.  If you develope  a fever above 101 F, pus (white drainage) or increased drainage or redness at the wound, or calf pain, call your surgeon's office.     Constipation Prevention    Complete by:  As directed   Drink plenty of fluids.  Prune juice may be helpful.  You may use a stool softener, such as Colace (over the counter) 100 mg twice a day.  Use MiraLax (over the counter) for constipation as needed.     Diet - low sodium heart healthy    Complete by:  As directed      Discharge instructions    Complete by:  As directed   Can change dressing in 24 hours and apply 4x4 gauze and tape.  DO NOT REMOVE SHOULDER IMMOBILIZER.  Do not use right arm.  Do not apply any creams or ointments to the incision.     Driving restrictions    Complete by:  As directed   No driving     Increase activity slowly as tolerated    Complete by:  As directed      Lifting restrictions    Complete by:  As directed   No lifting right arm.           Follow-up Information    Schedule an appointment as soon as possible for a visit with Eldred MangesYATES,MARK C, MD.   Specialty:  Orthopedic Surgery   Why:  need return office visit one week.    Contact information:   [address] [place] [address] [phone]       Discharge Plan:  discharge to home  Disposition:     Signed: Naida SleightOWENS,Elfie Costanza M 03/02/2015, 11:54 AM

## 2016-02-19 ENCOUNTER — Encounter (INDEPENDENT_AMBULATORY_CARE_PROVIDER_SITE_OTHER): Payer: Self-pay | Admitting: Orthopaedic Surgery

## 2016-02-19 ENCOUNTER — Ambulatory Visit (INDEPENDENT_AMBULATORY_CARE_PROVIDER_SITE_OTHER): Payer: Self-pay

## 2016-02-19 ENCOUNTER — Ambulatory Visit (INDEPENDENT_AMBULATORY_CARE_PROVIDER_SITE_OTHER): Payer: Self-pay | Admitting: Orthopaedic Surgery

## 2016-02-19 VITALS — BP 144/91 | HR 76 | Ht 76.0 in | Wt 173.0 lb

## 2016-02-19 DIAGNOSIS — S42021K Displaced fracture of shaft of right clavicle, subsequent encounter for fracture with nonunion: Secondary | ICD-10-CM

## 2016-02-19 DIAGNOSIS — M898X1 Other specified disorders of bone, shoulder: Secondary | ICD-10-CM

## 2016-02-19 NOTE — Progress Notes (Signed)
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 Continue regular work try to avoid the activities at load the clavicle which we discussed in detail  Follow-Up Instructions: Return in about 3 months (around 05/21/2016).   Orders:  Orders Placed This Encounter  Procedures  . XR Clavicle Right   No orders of the defined types were placed in this encounter.     Procedures: No procedures performed   Clinical Data: No additional findings.   Subjective: Chief Complaint  Patient presents with  . Right Shoulder - Follow-up    Right clavicle  . Follow-up    right shoulder injury on approximately 01/06/16    Patient is follow up for right clavicle swelling. History of ORIF Right Clavicle 02-14-15. Patient still feels like the clavicle is sticking up quite a bit and "it feels like it is out of place".  Had been doing well post operatively until 5 weeks ago when he was moving a tote around and "it took me to my knees".  Patient had xrays at previous visit 01/09/16. Denies any pain.    Review of Systems   Objective: Vital Signs: BP (!) 144/91 (BP Location: Right Arm)   Pulse 76   Ht 6\' 4"  (1.93 m)   Wt 173 lb (78.5 kg)   BMI 21.06 kg/m   Physical Exam  Constitutional: He is oriented to person, place, and time. He appears well-developed and well-nourished.  HENT:  Head: Normocephalic and atraumatic.  Eyes: Pupils are equal, round, and reactive to light.  Neck: Normal range of motion. Neck supple.  Right mid clavicle deformity healed right clavicle incision mid clavicle  deformity with medial and lateral portion of the bone moving together  Cardiovascular: Normal rate and regular rhythm.   Pulmonary/Chest: No respiratory distress.  Abdominal: He exhibits no distension. There is no tenderness.  Musculoskeletal: He exhibits deformity.  Right mid clavicle deformity with prominence. Neurologically intact upper extremities good range of motion of shoulder impingement.  Neurological: He is alert and oriented to person, place, and time. He has normal reflexes. He displays normal reflexes. No cranial nerve deficit. He exhibits normal muscle tone. Coordination normal.  Skin: Skin is warm and dry.  Psychiatric: He has a normal mood and affect. His behavior is normal. Thought content normal.    Ortho Exam see. Patient able to get his arms up above his head no limitation of internal/external rotation of the shoulders. 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 Neurovascularly intact good shoulder strength. Medial lateral clavicle with stress appear to move as one unit.  Specialty Comments:  No specialty comments available.  Imaging: Xr Clavicle Right  Result Date: 02/19/2016 For x-rays of the clavicle are taken. This shows broken plate with clavicle nonunion. 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 Medical history non-contributory     Family History  Problem Relation Age of Onset  . Heart disease Mother   . Hypertension Father     Past Surgical History:  Procedure Laterality Date  . FACIAL FRACTURE SURGERY     at 46 months old   . ORIF CLAVICULAR FRACTURE Right 02/14/2015   Procedure: OPEN REDUCTION INTERNAL FIXATION (ORIF) CLAVICULAR FRACTURE;  Surgeon: Eldred Manges, MD;  Location: [facility] OR;   Service: Orthopedics;  Laterality: Right;   Social History   Occupational History  . Not on file.   Social History Main Topics  . Smoking status: Current Every Day Smoker    Packs/day: 1.00    Years: 12.00    Types: Cigarettes  . Smokeless tobacco: Never Used  . Alcohol use No  . Drug use: No  . Sexual activity: Not on file

## 2016-05-20 ENCOUNTER — Ambulatory Visit (INDEPENDENT_AMBULATORY_CARE_PROVIDER_SITE_OTHER): Payer: Self-pay | Admitting: Orthopaedic Surgery

## 2016-05-20 ENCOUNTER — Encounter (INDEPENDENT_AMBULATORY_CARE_PROVIDER_SITE_OTHER): Payer: Self-pay | Admitting: Orthopaedic Surgery

## 2016-05-20 ENCOUNTER — Ambulatory Visit (INDEPENDENT_AMBULATORY_CARE_PROVIDER_SITE_OTHER): Payer: Self-pay

## 2016-05-20 VITALS — BP 129/90 | HR 85 | Ht 76.0 in | Wt 175.0 lb

## 2016-05-20 DIAGNOSIS — S42022K Displaced fracture of shaft of left clavicle, subsequent encounter for fracture with nonunion: Secondary | ICD-10-CM

## 2016-05-20 DIAGNOSIS — S42001K Fracture of unspecified part of right clavicle, subsequent encounter for fracture with nonunion: Secondary | ICD-10-CM

## 2016-05-20 NOTE — Progress Notes (Signed)
Office Visit Note   Patient: Robert [name]           Date of Birth: 05/05/1985           MRN: [id number] Visit Date: 05/20/2016              Requested by: Burton Apleyonald Roberts, MD [address] [place], [place] [id number] PCP: Lorenda PeckOBERTS, RONALD WAYNE, MD   Assessment & Plan: Visit Diagnoses:  1. 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 Plan will be plate removal removal of broken plate and screws. Re-fixation with the thicker stronger plate. He understands even with repeat surgery there is a potential that it may not heal successfully. We discussed smoking sensation which would help his nonunion rate.  Follow-Up Instructions: No Follow-up on file.   Orders:  Orders Placed This Encounter  Procedures  . XR Clavicle Right   No orders of the defined types were placed in this encounter.     Procedures: No procedures performed   Clinical Data: No additional findings.   Subjective: Chief Complaint  Patient presents with  . Right Shoulder - Follow-up    Patient returns for three month follow up right clavicle. He is status post ORIF right clavicle 02/14/2015 with injury afterwards and nonunion of fracture.  He states that he does not have a lot of pain but still has continuous deformity.     Review of Systems  Constitutional: Negative for chills and diaphoresis.  HENT: Negative for ear discharge, ear pain and nosebleeds.   Eyes: Negative for discharge and visual disturbance.  Respiratory: Negative for cough, choking and shortness of breath.        Positive for smoking  Cardiovascular: Negative for chest pain and palpitations.  Gastrointestinal: Negative for abdominal distention and abdominal pain.  Endocrine: Negative for cold  intolerance and heat intolerance.  Genitourinary: Negative for flank pain and hematuria.  Musculoskeletal:       Right clavicle fracture October 2016  Skin: Negative for rash and wound.  Neurological: Negative for seizures and speech difficulty.  Hematological: Negative for adenopathy. Does not bruise/bleed easily.  Psychiatric/Behavioral: Negative for agitation and suicidal ideas.     Objective: Vital Signs: BP 129/90   Pulse 85   Ht 6\' 4"  (1.93 m)   Wt 175 lb (79.4 kg)   BMI 21.30 kg/m   Physical Exam  Constitutional: He is oriented to person, place, and time. He appears well-developed and well-nourished.  HENT:  Head: Normocephalic and atraumatic.  Eyes: EOM are normal. Pupils are equal, round, and reactive to light.  Neck: No tracheal deviation present. No thyromegaly present.  Cardiovascular: Normal rate.   Pulmonary/Chest: Effort normal. He has no wheezes.  Abdominal: Soft. Bowel sounds are normal.  Musculoskeletal:  Prominent right midshaft clavicle with the 3 cm prominence. Overriding of the medial fragment over the lateral. Plate and screws are palpable. Sensation the hand fingers are  intact, normal reflexes no swelling of the extremity.  Neurological: He is alert and oriented to person, place, and time.  Skin: Skin is warm and dry. Capillary refill takes less than 2 seconds.  Psychiatric: He has a normal mood and affect. His behavior is normal. Judgment and thought content normal.    Ortho Exam  Specialty Comments:  No specialty  comments available.  Imaging: No results found.   PMFS History: Patient Active Problem List   Diagnosis Date Noted  . 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 Medical history non-contributory     Family History  Problem Relation Age of Onset  . Heart disease Mother   . Hypertension Father     Past Surgical History:  Procedure Laterality Date  . FACIAL FRACTURE SURGERY     at  88 months old   . ORIF CLAVICULAR FRACTURE Right 02/14/2015   Procedure: OPEN REDUCTION INTERNAL FIXATION (ORIF) CLAVICULAR FRACTURE;  Surgeon: Eldred Manges, MD;  Location: [facility] OR;  Service: Orthopedics;  Laterality: Right;   Social History   Occupational History  . Not on file.   Social History Main Topics  . Smoking status: Current Every Day Smoker    Packs/day: 1.00    Years: 12.00    Types: Cigarettes  . Smokeless tobacco: Never Used  . Alcohol use No  . Drug use: No  . Sexual activity: Not on file
